# Patient Record
Sex: Female | Born: 1960 | Race: White | Hispanic: No | Marital: Married | Smoking: Current every day smoker
Health system: Southern US, Community
[De-identification: ages and names within clinical notes are randomized; demographics above are authoritative.]

## PROBLEM LIST (undated history)

## (undated) DIAGNOSIS — H269 Unspecified cataract: Secondary | ICD-10-CM

## (undated) HISTORY — DX: Unspecified cataract: H26.9

---
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
## 1988-10-02 DIAGNOSIS — H269 Unspecified cataract: Secondary | ICD-10-CM

## 1988-10-02 HISTORY — DX: Unspecified cataract: H26.9

## 1997-10-02 HISTORY — PX: BREAST BIOPSY: SHX20

## 2004-07-18 ENCOUNTER — Ambulatory Visit: Payer: Self-pay | Admitting: Internal Medicine

## 2004-07-22 ENCOUNTER — Ambulatory Visit: Payer: Self-pay | Admitting: Internal Medicine

## 2004-08-18 ENCOUNTER — Ambulatory Visit: Payer: Self-pay | Admitting: Surgery

## 2004-10-02 HISTORY — PX: HIATAL HERNIA REPAIR: SHX195

## 2005-07-20 ENCOUNTER — Ambulatory Visit: Payer: Self-pay | Admitting: Internal Medicine

## 2005-08-11 ENCOUNTER — Ambulatory Visit: Payer: Self-pay | Admitting: Internal Medicine

## 2005-08-16 ENCOUNTER — Ambulatory Visit: Payer: Self-pay | Admitting: Internal Medicine

## 2005-09-14 ENCOUNTER — Ambulatory Visit: Payer: Self-pay | Admitting: Internal Medicine

## 2006-09-03 ENCOUNTER — Ambulatory Visit: Payer: Self-pay | Admitting: General Surgery

## 2006-11-02 ENCOUNTER — Ambulatory Visit: Payer: Self-pay | Admitting: Internal Medicine

## 2006-11-19 ENCOUNTER — Ambulatory Visit: Payer: Self-pay | Admitting: Internal Medicine

## 2006-11-27 ENCOUNTER — Ambulatory Visit: Payer: Self-pay | Admitting: Family Medicine

## 2006-12-01 ENCOUNTER — Ambulatory Visit: Payer: Self-pay | Admitting: Internal Medicine

## 2007-10-02 ENCOUNTER — Ambulatory Visit: Payer: Self-pay | Admitting: Internal Medicine

## 2008-01-22 ENCOUNTER — Ambulatory Visit: Payer: Self-pay | Admitting: Internal Medicine

## 2008-02-14 ENCOUNTER — Ambulatory Visit: Payer: Self-pay | Admitting: Internal Medicine

## 2008-04-06 ENCOUNTER — Ambulatory Visit: Payer: Self-pay | Admitting: General Surgery

## 2008-05-27 ENCOUNTER — Ambulatory Visit: Payer: Self-pay | Admitting: General Surgery

## 2008-06-03 ENCOUNTER — Inpatient Hospital Stay: Payer: Self-pay | Admitting: General Surgery

## 2008-07-03 ENCOUNTER — Ambulatory Visit: Payer: Self-pay | Admitting: General Surgery

## 2008-10-22 ENCOUNTER — Ambulatory Visit: Payer: Self-pay | Admitting: Internal Medicine

## 2012-05-07 ENCOUNTER — Ambulatory Visit: Payer: Self-pay | Admitting: Obstetrics and Gynecology

## 2012-06-07 ENCOUNTER — Ambulatory Visit: Payer: Self-pay | Admitting: Unknown Physician Specialty

## 2012-06-11 LAB — PATHOLOGY REPORT

## 2013-07-10 ENCOUNTER — Ambulatory Visit: Payer: Self-pay | Admitting: Obstetrics and Gynecology

## 2014-09-11 ENCOUNTER — Ambulatory Visit: Payer: Self-pay | Admitting: Obstetrics and Gynecology

## 2014-09-17 ENCOUNTER — Ambulatory Visit: Payer: Self-pay | Admitting: Obstetrics and Gynecology

## 2015-02-22 ENCOUNTER — Encounter: Payer: Self-pay | Admitting: *Deleted

## 2015-03-03 ENCOUNTER — Encounter: Payer: Self-pay | Admitting: General Surgery

## 2015-03-04 ENCOUNTER — Ambulatory Visit (INDEPENDENT_AMBULATORY_CARE_PROVIDER_SITE_OTHER): Payer: 59 | Admitting: General Surgery

## 2015-03-04 ENCOUNTER — Encounter: Payer: Self-pay | Admitting: General Surgery

## 2015-03-04 VITALS — BP 132/76 | HR 68 | Resp 12 | Ht 64.0 in | Wt 200.0 lb

## 2015-03-04 DIAGNOSIS — L729 Follicular cyst of the skin and subcutaneous tissue, unspecified: Secondary | ICD-10-CM

## 2015-03-04 NOTE — Patient Instructions (Signed)
Patient to follow up in one week with the nurse.

## 2015-03-04 NOTE — Progress Notes (Signed)
Patient ID: Kathleen Blair, female   DOB: 1961-05-15, 54 y.o.   MRN: [id number]  Chief Complaint  Patient presents with  . Other    Lipoma     HPI Kathleen Blair is a 54 y.o. female here today for evaluation of lipoma near left groin area. She first noticed the lipoma about 6 months ago. She did see her GYN doctor Dr. Feliberto Gottron for this problem during an exam and could remove if it started bothering her. Patient does report it drained yesterday, hurting more this week.  The patient mentioned that her menses have become irregular since December 2015. Heavy menses April 2016. None since. She did experience some hot flashes in the last week. This suggests that she is perimenopausal.  The patient reports she has difficulty with stress incontinence especially with coughing and sneezing. She is yet to discuss this with her gynecologist. HPI  Past Medical History  Diagnosis Date  . Cataract 1990    Past Surgical History  Procedure Laterality Date  . Tonsillectomy  1970  . Septoplasty  1980  . Pilonidal cyst excision  1980  . Hiatal hernia repair  2006  . Breast cyst excision Right 1990    Family History  Problem Relation Age of Onset  . Cancer Mother     lung cancer  . Cancer Father     esophageal cancer    Social History History  Substance Use Topics  . Smoking status: Current Every Day Smoker -- 1.00 packs/day for 30 years    Types: Cigarettes  . Smokeless tobacco: Not on file  . Alcohol Use: 0.0 oz/week    0 Standard drinks or equivalent per week     Comment: occasionally    No Known Allergies  Current Outpatient Prescriptions  Medication Sig Dispense Refill  . fexofenadine (ALLEGRA) 180 MG tablet Take 180 mg by mouth daily.    . Multiple Vitamins-Minerals (THR[employer] WOMENS PO) Take by mouth daily.    Marland Kitchen PARoxetine (PAXIL-CR) 12.5 MG 24 hr tablet   11   No current facility-administered medications for this visit.    Review of Systems Review of  Systems  Constitutional: Negative.   Respiratory: Positive for cough.   Cardiovascular: Negative.     Blood pressure 132/76, pulse 68, resp. rate 12, height  (1.626 m), weight 200 lb (90.719 kg), last menstrual period 01/14/2015.  Physical Exam Physical Exam  Constitutional: She is oriented to person, place, and time. She appears well-developed and well-nourished.  Cardiovascular: Normal rate, regular rhythm and normal heart sounds.   Pulmonary/Chest: Effort normal and breath sounds normal.  Abdominal: Soft. Bowel sounds are normal.  Lymphadenopathy:    She has no cervical adenopathy.  Neurological: She is alert and oriented to person, place, and time.  Skin: Skin is warm and dry.  1.5 cm inflamed cyst left labia       Assessment    Epidermal cyst with recent drainage.    Plan    Due to the long-standing presence prior to spontaneous drainage of elected to proceed to excision. The area was cleansed with alcohol and 10 mL of 0.5% Xylocaine with 0.25% Marcaine with 1-200,000 units of epinephrine was utilized well tolerated. Chlor prep was applied to the area. Through a elliptical incision the lesion was excised and sent for routine histology. The skin defect was closed with interrupted 40 Vicryls septic sutures. Telfa and Tegaderm dressing applied.  Ice provided for postoperative analgesia.  The patient will make  use of Tylenol, Advil, Aleve as needed for soreness.  She'll return in one week for wound evaluation with the staff.  The patient was encouraged to discontinue smoking as it will relieve pressure on the pelvic floor from coughing spells. Weight loss can be of benefit. She should plan to discuss options for management of stress incontinence with her gynecologist.        [name], Jeffrey W 03/04/2015, 12:05 PM

## 2015-03-09 ENCOUNTER — Telehealth: Payer: Self-pay | Admitting: *Deleted

## 2015-03-09 NOTE — Telephone Encounter (Signed)
-----   Message from Earline MayotteJeffrey W Byrnett, MD sent at 03/09/2015  9:44 AM EDT ----- Notify of the benign path. Thank you  ----- Message -----    From: [facility]    Sent: 03/08/2015   9:15 AM      To: Earline MayotteJeffrey W Byrnett, MD

## 2015-03-10 NOTE — Telephone Encounter (Signed)
Patient was notified of path results and was pleased

## 2015-03-12 ENCOUNTER — Ambulatory Visit (INDEPENDENT_AMBULATORY_CARE_PROVIDER_SITE_OTHER): Payer: 59

## 2015-03-12 DIAGNOSIS — L729 Follicular cyst of the skin and subcutaneous tissue, unspecified: Secondary | ICD-10-CM

## 2015-03-12 NOTE — Progress Notes (Signed)
Patient ID: Kathleen Blair, female   DOB: [birth year], 54 y.o.   MRN: [id number] Patient came in today for a wound check. The wound is clean, with no signs of infection noted. Wound edges approximated. Patient has been notified of her pathology results.  Follow up as needed.

## 2017-01-17 ENCOUNTER — Other Ambulatory Visit: Payer: Self-pay | Admitting: Physician Assistant

## 2017-01-17 DIAGNOSIS — Z1231 Encounter for screening mammogram for malignant neoplasm of breast: Secondary | ICD-10-CM

## 2017-02-07 ENCOUNTER — Ambulatory Visit: Payer: Self-pay

## 2017-03-13 ENCOUNTER — Ambulatory Visit
Admission: RE | Admit: 2017-03-13 | Discharge: 2017-03-13 | Disposition: A | Discharge status: Home / Self Care | Payer: 59 | Source: Ambulatory Visit | Attending: Physician Assistant | Admitting: Physician Assistant

## 2017-03-13 ENCOUNTER — Other Ambulatory Visit: Payer: Self-pay | Admitting: Physician Assistant

## 2017-03-13 DIAGNOSIS — Z1231 Encounter for screening mammogram for malignant neoplasm of breast: Secondary | ICD-10-CM | POA: Insufficient documentation

## 2018-07-08 ENCOUNTER — Other Ambulatory Visit: Payer: Self-pay | Admitting: Physician Assistant

## 2018-07-08 DIAGNOSIS — Z1231 Encounter for screening mammogram for malignant neoplasm of breast: Secondary | ICD-10-CM

## 2018-07-29 ENCOUNTER — Ambulatory Visit
Admission: RE | Admit: 2018-07-29 | Discharge: 2018-07-29 | Disposition: A | Discharge status: Home / Self Care | Payer: Managed Care, Other (non HMO) | Source: Ambulatory Visit | Attending: Physician Assistant | Admitting: Physician Assistant

## 2018-07-29 DIAGNOSIS — Z1231 Encounter for screening mammogram for malignant neoplasm of breast: Secondary | ICD-10-CM

## 2019-07-25 ENCOUNTER — Other Ambulatory Visit: Payer: Self-pay | Admitting: Physician Assistant

## 2019-07-25 DIAGNOSIS — Z1231 Encounter for screening mammogram for malignant neoplasm of breast: Secondary | ICD-10-CM

## 2019-08-01 ENCOUNTER — Telehealth: Payer: Self-pay | Admitting: *Deleted

## 2019-08-01 DIAGNOSIS — Z122 Encounter for screening for malignant neoplasm of respiratory organs: Secondary | ICD-10-CM

## 2019-08-01 NOTE — Telephone Encounter (Signed)
Received referral for initial lung cancer screening scan. Contacted patient and obtained smoking history,(current, 42 pack year) as well as answering questions related to screening process. Patient denies signs of lung cancer such as weight loss or hemoptysis. Patient denies comorbidity that would prevent curative treatment if lung cancer were found. Patient is scheduled for shared decision making visit at 315pm and CT scan on 08/08/19 at 4pm.

## 2019-08-08 ENCOUNTER — Ambulatory Visit
Admission: RE | Admit: 2019-08-08 | Discharge: 2019-08-08 | Disposition: A | Discharge status: Home / Self Care | Payer: BC Managed Care – PPO | Source: Ambulatory Visit | Attending: Oncology | Admitting: Oncology

## 2019-08-08 ENCOUNTER — Inpatient Hospital Stay: Payer: BC Managed Care – PPO | Attending: Oncology | Admitting: Oncology

## 2019-08-08 ENCOUNTER — Other Ambulatory Visit: Payer: Self-pay

## 2019-08-08 DIAGNOSIS — Z122 Encounter for screening for malignant neoplasm of respiratory organs: Secondary | ICD-10-CM | POA: Insufficient documentation

## 2019-08-08 DIAGNOSIS — Z87891 Personal history of nicotine dependence: Secondary | ICD-10-CM

## 2019-08-08 NOTE — Progress Notes (Signed)
Virtual Visit via Video Note  I connected with Kathleen Blair on 08/08/19 at  3:15 PM EST by a video enabled telemedicine application and verified that I am speaking with the correct person using two identifiers.  Location: Patient: Home Provider: Office   I discussed the limitations of evaluation and management by telemedicine and the availability of in person appointments. The patient expressed understanding and agreed to proceed.  I discussed the assessment and treatment plan with the patient. The patient was provided an opportunity to ask questions and all were answered. The patient agreed with the plan and demonstrated an understanding of the instructions.   The patient was advised to call back or seek an in-person evaluation if the symptoms worsen or if the condition fails to improve as anticipated.   In accordance with CMS guidelines, patient has met eligibility criteria including age, absence of signs or symptoms of lung cancer.  Social History   Tobacco Use  . Smoking status: Current Every Day Smoker    Packs/day: 1.00    Years: 30.00    Pack years: 30.00    Types: Cigarettes  Substance Use Topics  . Alcohol use: Yes    Alcohol/week: 0.0 standard drinks    Comment: occasionally  . Drug use: No      A shared decision-making session was conducted prior to the performance of CT scan. This includes one or more decision aids, includes benefits and harms of screening, follow-up diagnostic testing, over-diagnosis, false positive rate, and total radiation exposure.   Counseling on the importance of adherence to annual lung cancer LDCT screening, impact of co-morbidities, and ability or willingness to undergo diagnosis and treatment is imperative for compliance of the program.   Counseling on the importance of continued smoking cessation for former smokers; the importance of smoking cessation for current smokers, and information about tobacco cessation interventions have been  given to patient including [place] and [employer] [facility] [place] programs.   Written order for lung cancer screening with LDCT has been given to the patient and any and all questions have been answered to the best of my abilities.    Yearly follow up will be coordinated by Burgess Estelle, Thoracic Navigator.  I provided 15 minutes of face-to-face video visit time during this encounter, and > 50% was spent counseling as documented under my assessment & plan.   Jacquelin Hawking, NP

## 2019-08-12 ENCOUNTER — Encounter: Payer: Self-pay | Admitting: *Deleted

## 2020-08-10 ENCOUNTER — Telehealth: Payer: Self-pay | Admitting: *Deleted

## 2020-08-10 NOTE — Telephone Encounter (Signed)
Attempted to contact to schedule annual lung screening scan. However, there is no answer or voicemail option.

## 2020-08-13 NOTE — Telephone Encounter (Signed)
Attempted to contact patient but there is no answer or voicemail option.

## 2020-08-19 ENCOUNTER — Telehealth: Payer: Self-pay | Admitting: *Deleted

## 2020-08-19 ENCOUNTER — Encounter: Payer: Self-pay | Admitting: *Deleted

## 2020-08-19 NOTE — Telephone Encounter (Signed)
Unable to contact patient by phone or leave voicemail in attempt to schedule annual lung screening scan. Will send letter in attempt to contact.

## 2020-11-26 ENCOUNTER — Other Ambulatory Visit: Payer: Self-pay | Admitting: Physician Assistant

## 2020-11-26 DIAGNOSIS — Z1231 Encounter for screening mammogram for malignant neoplasm of breast: Secondary | ICD-10-CM

## 2020-12-20 ENCOUNTER — Ambulatory Visit
Admission: RE | Admit: 2020-12-20 | Discharge: 2020-12-20 | Disposition: A | Discharge status: Home / Self Care | Payer: BC Managed Care – PPO | Source: Ambulatory Visit | Attending: Physician Assistant | Admitting: Physician Assistant

## 2020-12-20 ENCOUNTER — Other Ambulatory Visit: Payer: Self-pay

## 2020-12-20 DIAGNOSIS — Z1231 Encounter for screening mammogram for malignant neoplasm of breast: Secondary | ICD-10-CM | POA: Diagnosis not present

## 2021-08-30 IMAGING — MG MM DIGITAL SCREENING BILAT W/ TOMO AND CAD
6 of 12 series · 6 of 36 positions shown · non-contrast
Comparison: Previous exam(s).

CLINICAL DATA: Screening.

EXAM:
DIGITAL SCREENING BILATERAL MAMMOGRAM WITH TOMOSYNTHESIS AND CAD
TECHNIQUE: Bilateral screening digital craniocaudal and mediolateral oblique
mammograms were obtained. Bilateral screening digital breast
tomosynthesis was performed. The images were evaluated with
computer-aided detection.

[L CC synth-2D (1 of 2)]
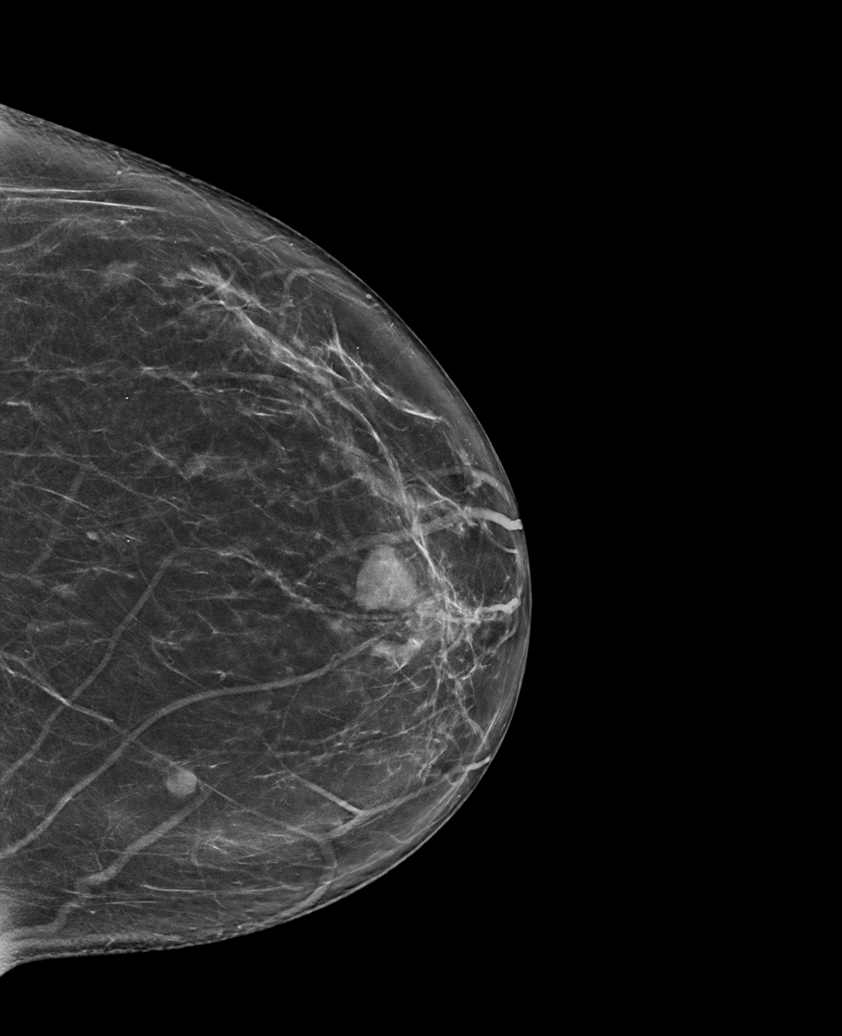

[L CC synth-2D (2 of 2)]
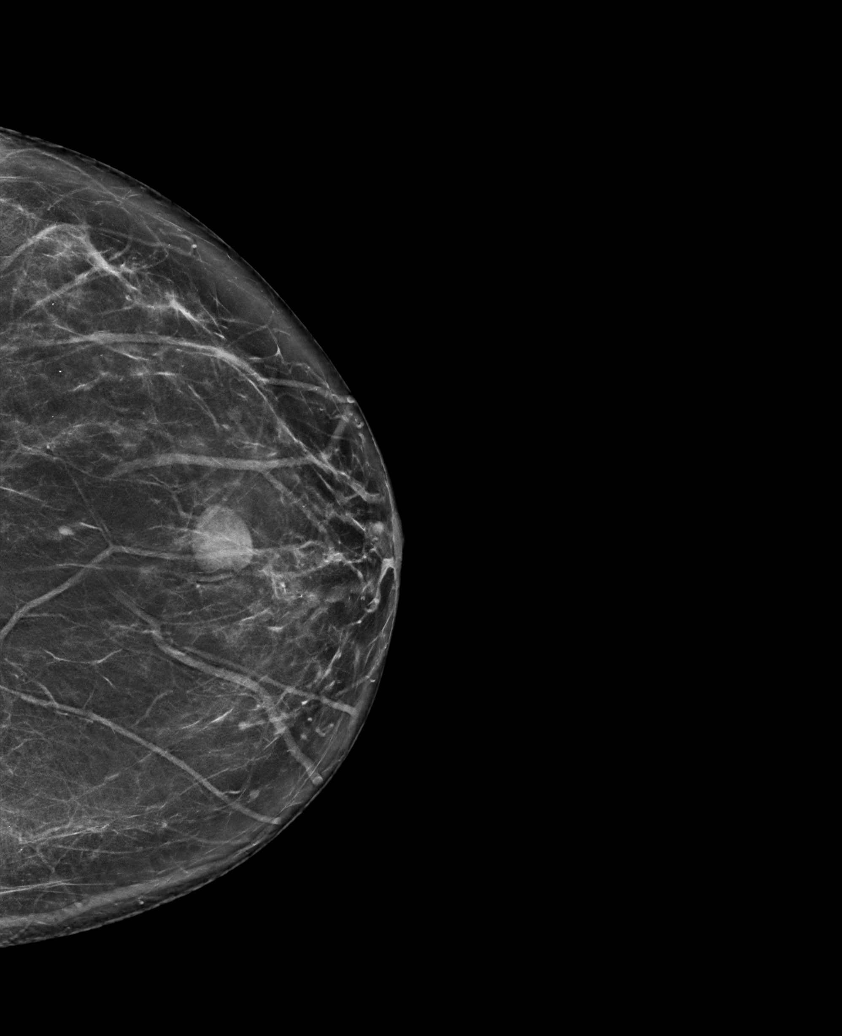

[R CC synth-2D (1 of 2)]
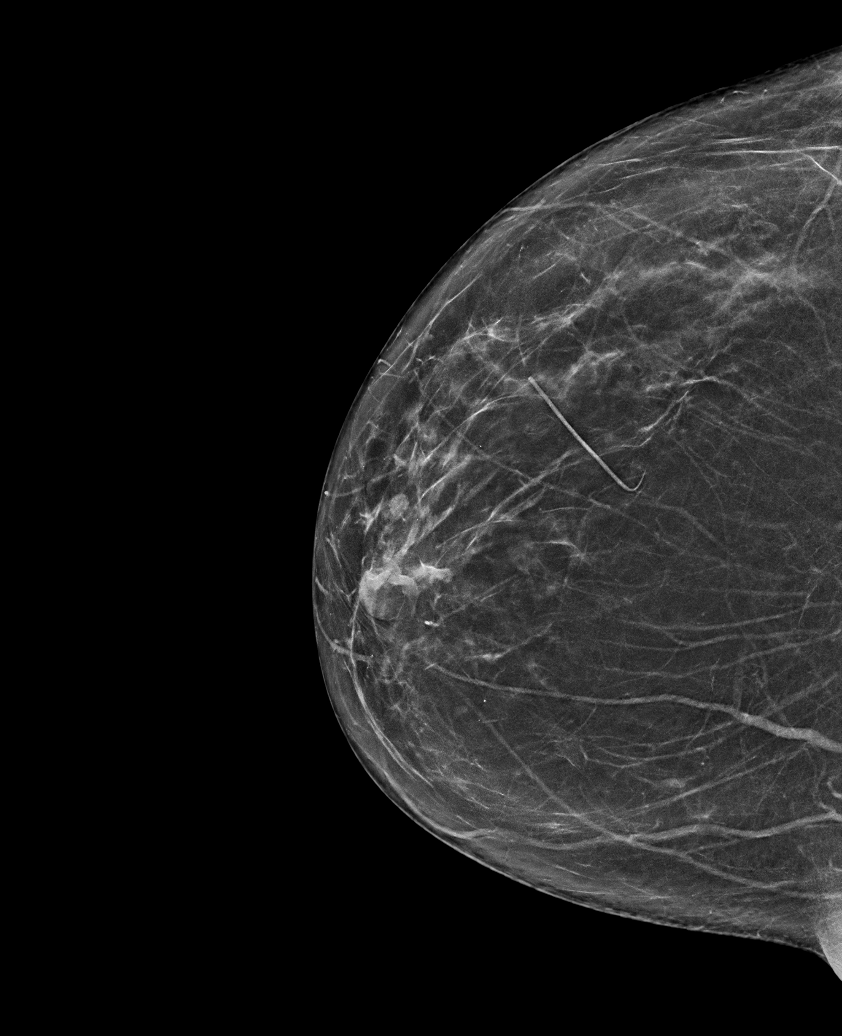

[R MLO synth-2D]
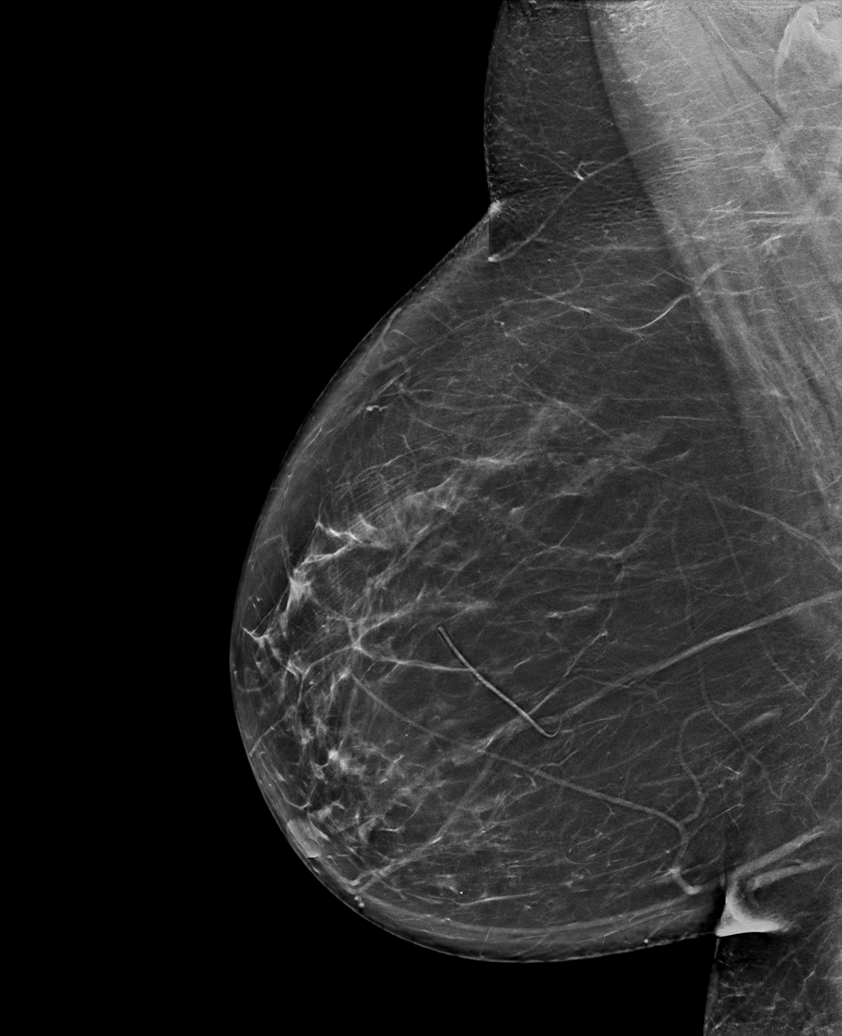

[L MLO synth-2D]
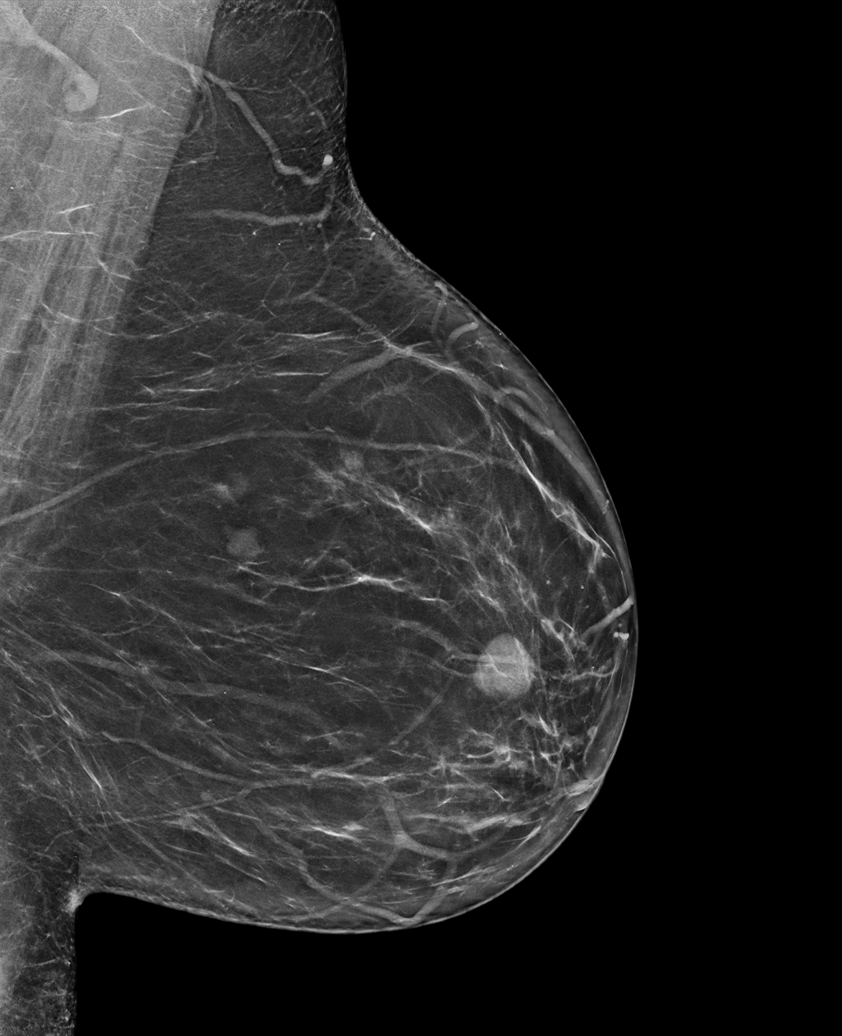

[R CC synth-2D (2 of 2)]
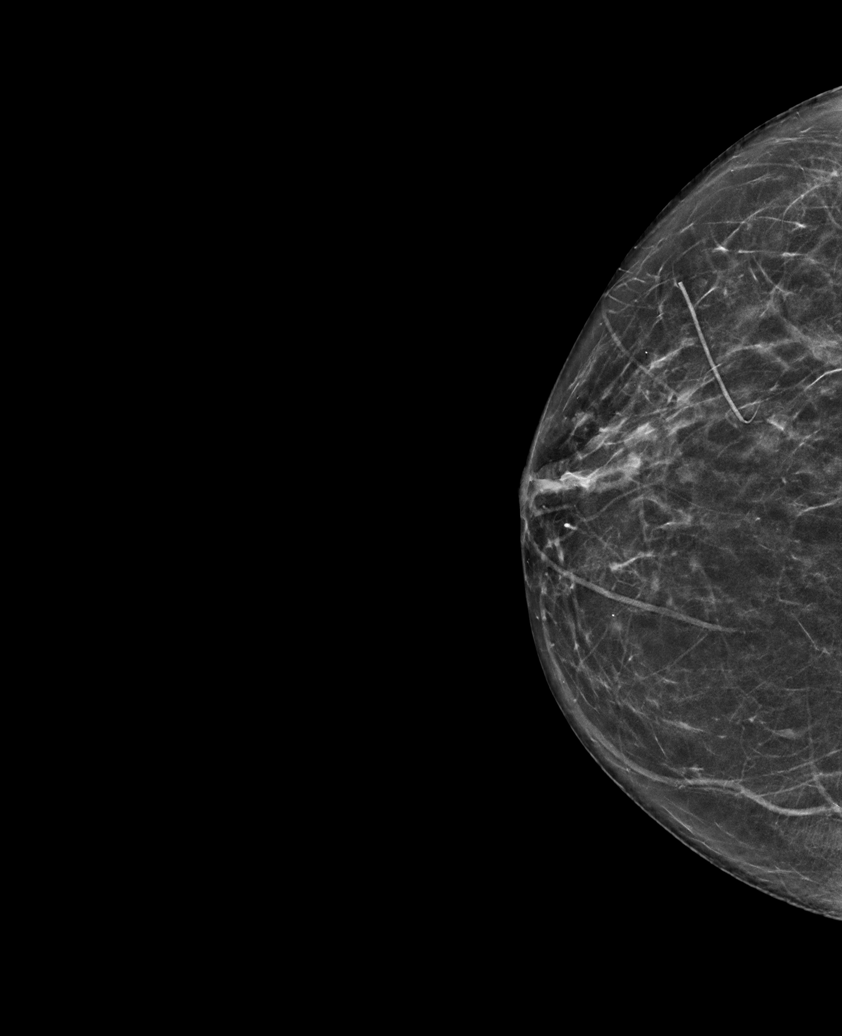

[6 of 36 positions shown; findings below may reference images not displayed]

ACR Breast Density Category b: There are scattered areas of
fibroglandular density.
FINDINGS: There are no findings suspicious for malignancy. The images were
evaluated with computer-aided detection.
IMPRESSION: No mammographic evidence of malignancy. A result letter of this
screening mammogram will be mailed directly to the patient.

RECOMMENDATION:
Screening mammogram in one year. (Code:WJ-I-BG6)

BI-RADS CATEGORY  1: Negative.

## 2021-09-12 ENCOUNTER — Other Ambulatory Visit: Payer: Self-pay | Admitting: Physician Assistant

## 2021-09-12 DIAGNOSIS — Z1231 Encounter for screening mammogram for malignant neoplasm of breast: Secondary | ICD-10-CM

## 2022-03-20 ENCOUNTER — Other Ambulatory Visit: Payer: Self-pay | Admitting: Physician Assistant

## 2022-03-20 DIAGNOSIS — Z1231 Encounter for screening mammogram for malignant neoplasm of breast: Secondary | ICD-10-CM

## 2022-10-30 ENCOUNTER — Ambulatory Visit
Admission: RE | Admit: 2022-10-30 | Discharge: 2022-10-30 | Disposition: A | Discharge status: Home / Self Care | Payer: BC Managed Care – PPO | Source: Ambulatory Visit | Attending: Physician Assistant | Admitting: Physician Assistant

## 2022-10-30 DIAGNOSIS — Z1231 Encounter for screening mammogram for malignant neoplasm of breast: Secondary | ICD-10-CM | POA: Diagnosis not present

## 2023-10-01 ENCOUNTER — Other Ambulatory Visit: Payer: Self-pay | Admitting: Physician Assistant

## 2023-10-01 DIAGNOSIS — Z1231 Encounter for screening mammogram for malignant neoplasm of breast: Secondary | ICD-10-CM

## 2024-03-24 ENCOUNTER — Inpatient Hospital Stay: Admission: RE | Admit: 2024-03-24 | Source: Ambulatory Visit

## 2024-04-21 ENCOUNTER — Ambulatory Visit
Admission: RE | Admit: 2024-04-21 | Discharge: 2024-04-21 | Disposition: A | Discharge status: Home / Self Care | Source: Ambulatory Visit | Attending: Physician Assistant | Admitting: Physician Assistant

## 2024-04-21 DIAGNOSIS — Z1231 Encounter for screening mammogram for malignant neoplasm of breast: Secondary | ICD-10-CM | POA: Diagnosis present

## 2024-05-07 ENCOUNTER — Other Ambulatory Visit: Payer: Self-pay | Admitting: Physician Assistant

## 2024-05-07 DIAGNOSIS — F17210 Nicotine dependence, cigarettes, uncomplicated: Secondary | ICD-10-CM

## 2024-05-07 DIAGNOSIS — F17200 Nicotine dependence, unspecified, uncomplicated: Secondary | ICD-10-CM

## 2024-05-07 DIAGNOSIS — Z0000 Encounter for general adult medical examination without abnormal findings: Secondary | ICD-10-CM

## 2024-05-19 ENCOUNTER — Ambulatory Visit
Admission: RE | Admit: 2024-05-19 | Discharge: 2024-05-19 | Disposition: A | Discharge status: Home / Self Care | Source: Ambulatory Visit | Attending: Physician Assistant | Admitting: Physician Assistant

## 2024-05-19 DIAGNOSIS — Z0000 Encounter for general adult medical examination without abnormal findings: Secondary | ICD-10-CM | POA: Insufficient documentation

## 2024-05-19 DIAGNOSIS — F17200 Nicotine dependence, unspecified, uncomplicated: Secondary | ICD-10-CM | POA: Insufficient documentation

## 2024-05-19 DIAGNOSIS — F17210 Nicotine dependence, cigarettes, uncomplicated: Secondary | ICD-10-CM | POA: Diagnosis present
